# Patient Record
Sex: Male | Born: 1955 | Race: White | Hispanic: No | Marital: Married | Smoking: Current every day smoker
Health system: Southern US, Community
[De-identification: ages and names within clinical notes are randomized; demographics above are authoritative.]

## PROBLEM LIST (undated history)

## (undated) DIAGNOSIS — F17210 Nicotine dependence, cigarettes, uncomplicated: Secondary | ICD-10-CM

## (undated) DIAGNOSIS — F419 Anxiety disorder, unspecified: Secondary | ICD-10-CM

## (undated) DIAGNOSIS — F32A Depression, unspecified: Secondary | ICD-10-CM

## (undated) DIAGNOSIS — I700 Atherosclerosis of aorta: Secondary | ICD-10-CM

## (undated) DIAGNOSIS — I10 Essential (primary) hypertension: Secondary | ICD-10-CM

## (undated) DIAGNOSIS — E785 Hyperlipidemia, unspecified: Secondary | ICD-10-CM

## (undated) DIAGNOSIS — Z87442 Personal history of urinary calculi: Secondary | ICD-10-CM

## (undated) DIAGNOSIS — J439 Emphysema, unspecified: Secondary | ICD-10-CM

## (undated) HISTORY — PX: APPENDECTOMY: SHX54

## (undated) HISTORY — PX: ORIF PROXIMAL TIBIAL PLATEAU FRACTURE: SUR953

## (undated) HISTORY — PX: CHOLECYSTECTOMY: SHX55

## (undated) HISTORY — PX: ORIF FEMUR FRACTURE: SHX2119

## (undated) HISTORY — PX: ORIF FOREARM FRACTURE: SHX2124

---

## 2021-04-12 ENCOUNTER — Other Ambulatory Visit: Payer: Self-pay | Admitting: *Deleted

## 2021-04-12 DIAGNOSIS — Z87891 Personal history of nicotine dependence: Secondary | ICD-10-CM

## 2021-04-12 DIAGNOSIS — F17210 Nicotine dependence, cigarettes, uncomplicated: Secondary | ICD-10-CM

## 2021-04-27 ENCOUNTER — Ambulatory Visit (INDEPENDENT_AMBULATORY_CARE_PROVIDER_SITE_OTHER): Payer: Self-pay | Admitting: Acute Care

## 2021-04-27 ENCOUNTER — Other Ambulatory Visit: Payer: Self-pay

## 2021-04-27 ENCOUNTER — Encounter: Payer: Self-pay | Admitting: Acute Care

## 2021-04-27 DIAGNOSIS — F17210 Nicotine dependence, cigarettes, uncomplicated: Secondary | ICD-10-CM

## 2021-04-27 NOTE — Progress Notes (Addendum)
Virtual Visit via Telephone Note ? ?I connected with Steven Watson on 04/27/21 at 10:00 AM EDT by telephone and verified that I am speaking with the correct person using two identifiers. ? ?Location: ?Patient:  At home ?Provider:  Working from home ?  ?I discussed the limitations, risks, security and privacy concerns of performing an evaluation and management service by telephone and the availability of in person appointments. I also discussed with the patient that there may be a patient responsible charge related to this service. The patient expressed understanding and agreed to proceed. ? ? ? ? ?Shared Decision Making Visit Lung Cancer Screening Program ?([id number]) ? ? ?Eligibility: ?Age 66 y.o. ?Pack Years Smoking History Calculation 25 pack year smoking history ?(# packs/per year x # years smoked) ?Recent History of coughing up blood  no ?Unexplained weight loss? no ?( >Than 15 pounds within the last 6 months ) ?Prior History Lung / other cancer no ?(Diagnosis within the last 5 years already requiring surveillance chest CT Scans). ?Smoking Status Current Smoker ?Former Smokers: Years since quit:  NA ? Quit Date:  NA ? ?Visit Components: ?Discussion included one or more decision making aids. yes ?Discussion included risk/benefits of screening. yes ?Discussion included potential follow up diagnostic testing for abnormal scans. yes ?Discussion included meaning and risk of over diagnosis. yes ?Discussion included meaning and risk of False Positives. yes ?Discussion included meaning of total radiation exposure. yes ? ?Counseling Included: ?Importance of adherence to annual lung cancer LDCT screening. yes ?Impact of comorbidities on ability to participate in the program. yes ?Ability and willingness to under diagnostic treatment. yes ? ?Smoking Cessation Counseling: ?Current Smokers:  ?Discussed importance of smoking cessation. yes ?Information about tobacco cessation classes and interventions provided to patient.  yes ?Patient provided with "ticket" for LDCT Scan. yes ?Symptomatic Patient. no ? Counseling NA ?Diagnosis Code: Tobacco Use Z72.0 ?Asymptomatic Patient yes ? Counseling (Intermediate counseling: > three minutes counseling) T0626 ?Former Smokers:  ?Discussed the importance of maintaining cigarette abstinence. yes ?Diagnosis Code: Personal History of Nicotine Dependence. R48.546 ?Information about tobacco cessation classes and interventions provided to patient. Yes ?Patient provided with "ticket" for LDCT Scan. yes ?Written Order for Lung Cancer Screening with LDCT placed in Epic. Yes ?(CT Chest Lung Cancer Screening Low Dose W/O CM) [id number] ?Z12.2-Screening of respiratory organs ?Z87.891-Personal history of nicotine dependence ? ?I have spent 25 minutes of face to face/ virtual visit   time with  Steven Watson discussing the risks and benefits of lung cancer screening. We viewed / discussed a power point together that explained in detail the above noted topics. We paused at intervals to allow for questions to be asked and answered to ensure understanding.We discussed that the single most powerful action that he can take to decrease his risk of developing lung cancer is to quit smoking. We discussed whether or not he is ready to commit to setting a quit date. We discussed options for tools to aid in quitting smoking including nicotine replacement therapy, non-nicotine medications, support groups, Quit Smart classes, and behavior modification. We discussed that often times setting smaller, more achievable goals, such as eliminating 1 cigarette a day for a week and then 2 cigarettes a day for a week can be helpful in slowly decreasing the number of cigarettes smoked. This allows for a sense of accomplishment as well as providing a clinical benefit. I provided  him  with smoking cessation  information  with contact information for community resources, classes, free nicotine replacement therapy,  and access to mobile apps,  text messaging, and on-line smoking cessation help. I have also provided  him  the office contact information in the event he needs to contact me, or the screening staff. We discussed the time and location of the scan, and that either Doroteo Glassman RN, Joella Prince, RN  or I will call / send a letter with the results within 24-72 hours of receiving them. The patient verbalized understanding of all of  the above and had no further questions upon leaving the office. They have my contact information in the event they have any further questions. ? ?I spent 3 minutes counseling on smoking cessation and the health risks of continued tobacco abuse. ? ?I explained to the patient that there has been a high incidence of coronary artery disease noted on these exams. I explained that this is a non-gated exam therefore degree or severity cannot be determined. This patient is on statin therapy. I have asked the patient to follow-up with their PCP regarding any incidental finding of coronary artery disease and management with diet or medication as their PCP  feels is clinically indicated. The patient verbalized understanding of the above and had no further questions upon completion of the visit. ? ?  ? ? ?Magdalen Spatz, NP ?04/27/2021 ? ? ? ? ? ? ?

## 2021-04-27 NOTE — Patient Instructions (Signed)

## 2021-04-29 ENCOUNTER — Other Ambulatory Visit: Payer: Self-pay

## 2021-04-29 ENCOUNTER — Ambulatory Visit
Admission: RE | Admit: 2021-04-29 | Discharge: 2021-04-29 | Disposition: A | Discharge status: Home / Self Care | Payer: Medicare Other | Source: Ambulatory Visit | Attending: Acute Care | Admitting: Acute Care

## 2021-04-29 DIAGNOSIS — Z87891 Personal history of nicotine dependence: Secondary | ICD-10-CM | POA: Diagnosis present

## 2021-04-29 DIAGNOSIS — F17210 Nicotine dependence, cigarettes, uncomplicated: Secondary | ICD-10-CM | POA: Insufficient documentation

## 2021-05-02 ENCOUNTER — Other Ambulatory Visit: Payer: Self-pay

## 2021-05-02 DIAGNOSIS — F17210 Nicotine dependence, cigarettes, uncomplicated: Secondary | ICD-10-CM

## 2021-05-02 DIAGNOSIS — Z87891 Personal history of nicotine dependence: Secondary | ICD-10-CM

## 2021-05-12 ENCOUNTER — Other Ambulatory Visit (HOSPITAL_COMMUNITY): Payer: Self-pay | Admitting: Infectious Diseases

## 2021-05-12 ENCOUNTER — Other Ambulatory Visit: Payer: Self-pay | Admitting: Infectious Diseases

## 2021-05-12 DIAGNOSIS — R1031 Right lower quadrant pain: Secondary | ICD-10-CM

## 2021-05-16 ENCOUNTER — Ambulatory Visit
Admission: RE | Admit: 2021-05-16 | Discharge: 2021-05-16 | Disposition: A | Discharge status: Home / Self Care | Payer: Medicare Other | Source: Ambulatory Visit | Attending: Infectious Diseases | Admitting: Infectious Diseases

## 2021-05-16 DIAGNOSIS — R1031 Right lower quadrant pain: Secondary | ICD-10-CM | POA: Diagnosis present

## 2021-05-24 ENCOUNTER — Other Ambulatory Visit: Payer: Medicare Other

## 2021-05-31 ENCOUNTER — Encounter: Payer: Self-pay | Admitting: Urology

## 2021-05-31 ENCOUNTER — Ambulatory Visit (INDEPENDENT_AMBULATORY_CARE_PROVIDER_SITE_OTHER): Payer: Medicare Other | Admitting: Urology

## 2021-05-31 VITALS — BP 177/82 | HR 73 | Ht 68.0 in | Wt 155.0 lb

## 2021-05-31 DIAGNOSIS — N200 Calculus of kidney: Secondary | ICD-10-CM | POA: Diagnosis not present

## 2021-05-31 NOTE — Progress Notes (Signed)
? ?  05/31/21 ?9:53 AM  ? ?Steven Watson ?November 10, 1955 ?[id number] ? ?CC: Nephrolithiasis ? ?HPI: ?I saw Steven Watson today for nephrolithiasis.  He originally presented to his PCP on 05/12/2021 with right-sided flank pain, urinalysis showed pyuria but culture was negative, and he was started on Flomax.  He felt like he passed a few stones on the right side and his pain had improved, and a CT was ultimately performed on 05/16/2021.  This showed no right-sided hydronephrosis or right-sided stones, and a punctate small left lower pole nonobstructive stone.  He feels well today and denies any urinary symptoms or complaints. ? ?He has a history of recurrent kidney stones, and required ureteroscopy about 7 years ago in [place].  We will obtain those outside records. ? ? ?Social History:  reports that he has quit smoking. His smoking use included cigarettes. He has a 25.00 pack-year smoking history. He has been exposed to tobacco smoke. He has never used smokeless tobacco. He reports that he does not drink alcohol and does not use drugs. ? ?Physical Exam: ?BP (!) 177/82   Pulse 73   Ht '5\' 8"'$  (1.727 m)   Wt 155 lb (70.3 kg)   BMI 23.57 kg/m?   ? ?Constitutional:  Alert and oriented, No acute distress. ?Cardiovascular: No clubbing, cyanosis, or edema. ?Respiratory: Normal respiratory effort, no increased work of breathing. ?GI: Abdomen is soft, nontender, nondistended, no abdominal masses ? ?Laboratory Data: ?Reviewed ? ?Pertinent Imaging: ?I have personally viewed and interpreted the CT showing a punctate left lower pole stone, no hydronephrosis or ureteral stones. ? ?Assessment & Plan:   ?66 year old male with recurrent nephrolithiasis, recently passed right-sided stone, with recent CT 05/16/2021 showing only a punctate left lower pole stone and no ureteral stones or hydronephrosis. ? ?We discussed general stone prevention strategies including adequate hydration with goal of producing 2.5 L of urine daily, increasing citric  acid intake, increasing calcium intake during high oxalate meals, minimizing animal protein, and decreasing salt intake. Information about dietary recommendations given today.  ? ?RTC 1 year KUB for surveillance ? ? ?Nickolas Madrid, MD ?05/31/2021 ? ?[facility] ?[address] ?[place], [place] [address] ?[phone] ? ?

## 2021-05-31 NOTE — Patient Instructions (Signed)
Dietary Guidelines to Help Prevent Kidney Stones Kidney stones are deposits of minerals and salts that form inside your kidneys. Your risk of developing kidney stones may be greater depending on your diet, your lifestyle, the medicines you take, and whether you have certain medical conditions. Most people can lower their chances of developing kidney stones by following the instructions below. Your dietitian may give you more specific instructions depending on your overall health and the type of kidney stones you tend to develop. What are tips for following this plan? Reading food labels  Choose foods with "no salt added" or "low-salt" labels. Limit your salt (sodium) intake to less than 1,500 mg a day. Choose foods with calcium for each meal and snack. Try to eat about 300 mg of calcium at each meal. Foods that contain 200-500 mg of calcium a serving include: 8 oz (237 mL) of milk, calcium-fortifiednon-dairy milk, and calcium-fortifiedfruit juice. Calcium-fortified means that calcium has been added to these drinks. 8 oz (237 mL) of kefir, yogurt, and soy yogurt. 4 oz (114 g) of tofu. 1 oz (28 g) of cheese. 1 cup (150 g) of dried figs. 1 cup (91 g) of cooked broccoli. One 3 oz (85 g) can of sardines or mackerel. Most people need 1,000-1,500 mg of calcium a day. Talk to your dietitian about how much calcium is recommended for you. Shopping Buy plenty of fresh fruits and vegetables. Most people do not need to avoid fruits and vegetables, even if these foods contain nutrients that may contribute to kidney stones. When shopping for convenience foods, choose: Whole pieces of fruit. Pre-made salads with dressing on the side. Low-fat fruit and yogurt smoothies. Avoid buying frozen meals or prepared deli foods. These can be high in sodium. Look for foods with live cultures, such as yogurt and kefir. Choose high-fiber grains, such as whole-wheat breads, oat bran, and wheat cereals. Cooking Do not add  salt to food when cooking. Place a salt shaker on the table and allow each person to add his or her own salt to taste. Use vegetable protein, such as beans, textured vegetable protein (TVP), or tofu, instead of meat in pasta, casseroles, and soups. Meal planning Eat less salt, if told by your dietitian. To do this: Avoid eating processed or pre-made food. Avoid eating fast food. Eat less animal protein, including cheese, meat, poultry, or fish, if told by your dietitian. To do this: Limit the number of times you have meat, poultry, fish, or cheese each week. Eat a diet free of meat at least 2 days a week. Eat only one serving each day of meat, poultry, fish, or seafood. When you prepare animal protein, cut pieces into small portion sizes. For most meat and fish, one serving is about the size of the palm of your hand. Eat at least five servings of fresh fruits and vegetables each day. To do this: Keep fruits and vegetables on hand for snacks. Eat one piece of fruit or a handful of berries with breakfast. Have a salad and fruit at lunch. Have two kinds of vegetables at dinner. Limit foods that are high in a substance called oxalate. These include: Spinach (cooked), rhubarb, beets, sweet potatoes, and Swiss chard. Peanuts. Potato chips, french fries, and baked potatoes with skin on. Nuts and nut products. Chocolate. If you regularly take a diuretic medicine, make sure to eat at least 1 or 2 servings of fruits or vegetables that are high in potassium each day. These include: Avocado. Banana. Orange, prune,   carrot, or tomato juice. Baked potato. Cabbage. Beans and split peas. Lifestyle  Drink enough fluid to keep your urine pale yellow. This is the most important thing you can do. Spread your fluid intake throughout the day. If you drink alcohol: Limit how much you use to: 0-1 drink a day for women who are not pregnant. 0-2 drinks a day for men. Be aware of how much alcohol is in your  drink. In the [place] one drink equals one 12 oz bottle of beer (355 mL), one 5 oz glass of wine (148 mL), or one 1 oz glass of hard liquor (44 mL). Lose weight if told by your health care provider. Work with your dietitian to find an eating plan and weight loss strategies that work best for you. General information Talk to your health care provider and dietitian about taking daily supplements. You may be told the following depending on your health and the cause of your kidney stones: Not to take supplements with vitamin C. To take a calcium supplement. To take a daily probiotic supplement. To take other supplements such as magnesium, fish oil, or vitamin B6. Take over-the-counter and prescription medicines only as told by your health care provider. These include supplements. What foods should I limit? Limit your intake of the following foods, or eat them as told by your dietitian. Vegetables Spinach. Rhubarb. Beets. Canned vegetables. Pickles. Olives. Baked potatoes with skin. Grains Wheat bran. Baked goods. Salted crackers. Cereals high in sugar. Meats and other proteins Nuts. Nut butters. Large portions of meat, poultry, or fish. Salted, precooked, or cured meats, such as sausages, meat loaves, and hot dogs. Dairy Cheese. Beverages Regular soft drinks. Regular vegetable juice. Seasonings and condiments Seasoning blends with salt. Salad dressings. Soy sauce. Ketchup. Barbecue sauce. Other foods Canned soups. Canned pasta sauce. Casseroles. Pizza. Lasagna. Frozen meals. Potato chips. French fries. The items listed above may not be a complete list of foods and beverages you should limit. Contact a dietitian for more information. What foods should I avoid? Talk to your dietitian about specific foods you should avoid based on the type of kidney stones you have and your overall health. Fruits Grapefruit. The item listed above may not be a complete list of foods and beverages you should  avoid. Contact a dietitian for more information. Summary Kidney stones are deposits of minerals and salts that form inside your kidneys. You can lower your risk of kidney stones by making changes to your diet. The most important thing you can do is drink enough fluid. Drink enough fluid to keep your urine pale yellow. Talk to your dietitian about how much calcium you should have each day, and eat less salt and animal protein as told by your dietitian. This information is not intended to replace advice given to you by your health care provider. Make sure you discuss any questions you have with your health care provider. Document Revised: 09/27/2020 Document Reviewed: 09/27/2020 Elsevier Patient Education  2023 Elsevier Inc.  

## 2022-02-10 ENCOUNTER — Other Ambulatory Visit: Payer: Self-pay | Admitting: Sports Medicine

## 2022-02-10 DIAGNOSIS — M25511 Pain in right shoulder: Secondary | ICD-10-CM

## 2022-02-10 DIAGNOSIS — M19011 Primary osteoarthritis, right shoulder: Secondary | ICD-10-CM

## 2022-02-10 DIAGNOSIS — S4991XA Unspecified injury of right shoulder and upper arm, initial encounter: Secondary | ICD-10-CM

## 2022-02-16 ENCOUNTER — Ambulatory Visit: Payer: Medicare Other

## 2022-02-21 ENCOUNTER — Ambulatory Visit
Admission: RE | Admit: 2022-02-21 | Discharge: 2022-02-21 | Disposition: A | Discharge status: Home / Self Care | Payer: Medicare Other | Source: Ambulatory Visit | Attending: Sports Medicine | Admitting: Sports Medicine

## 2022-02-21 DIAGNOSIS — S4991XA Unspecified injury of right shoulder and upper arm, initial encounter: Secondary | ICD-10-CM | POA: Insufficient documentation

## 2022-02-21 DIAGNOSIS — M19011 Primary osteoarthritis, right shoulder: Secondary | ICD-10-CM | POA: Diagnosis present

## 2022-02-21 DIAGNOSIS — M25511 Pain in right shoulder: Secondary | ICD-10-CM | POA: Diagnosis not present

## 2022-03-16 DIAGNOSIS — D120 Benign neoplasm of cecum: Secondary | ICD-10-CM | POA: Diagnosis not present

## 2022-03-16 DIAGNOSIS — Z1211 Encounter for screening for malignant neoplasm of colon: Secondary | ICD-10-CM | POA: Diagnosis present

## 2022-03-16 DIAGNOSIS — D122 Benign neoplasm of ascending colon: Secondary | ICD-10-CM | POA: Diagnosis not present

## 2022-03-16 DIAGNOSIS — K640 First degree hemorrhoids: Secondary | ICD-10-CM | POA: Diagnosis not present

## 2022-05-01 ENCOUNTER — Ambulatory Visit: Payer: Medicare Other | Attending: Infectious Diseases

## 2022-05-10 ENCOUNTER — Other Ambulatory Visit: Payer: Self-pay

## 2022-05-16 ENCOUNTER — Telehealth: Payer: Self-pay

## 2022-05-16 NOTE — Telephone Encounter (Signed)
Returned call to patient for scheduling annual LDCT. No answer.  Left VM and call back number

## 2022-06-05 ENCOUNTER — Other Ambulatory Visit: Payer: Self-pay | Admitting: *Deleted

## 2022-06-05 DIAGNOSIS — N200 Calculus of kidney: Secondary | ICD-10-CM

## 2022-06-06 ENCOUNTER — Ambulatory Visit: Payer: Medicare Other | Admitting: Urology

## 2022-06-13 ENCOUNTER — Encounter: Payer: Self-pay | Admitting: Urology

## 2022-06-13 ENCOUNTER — Ambulatory Visit
Admission: RE | Admit: 2022-06-13 | Discharge: 2022-06-13 | Disposition: A | Discharge status: Home / Self Care | Payer: Medicare Other | Attending: Urology | Admitting: Urology

## 2022-06-13 ENCOUNTER — Ambulatory Visit
Admission: RE | Admit: 2022-06-13 | Discharge: 2022-06-13 | Disposition: A | Discharge status: Home / Self Care | Payer: Medicare Other | Source: Ambulatory Visit | Attending: Urology | Admitting: Urology

## 2022-06-13 ENCOUNTER — Ambulatory Visit (INDEPENDENT_AMBULATORY_CARE_PROVIDER_SITE_OTHER): Payer: Medicare Other | Admitting: Urology

## 2022-06-13 VITALS — BP 136/71 | HR 65 | Ht 67.5 in | Wt 174.0 lb

## 2022-06-13 DIAGNOSIS — Z125 Encounter for screening for malignant neoplasm of prostate: Secondary | ICD-10-CM | POA: Diagnosis not present

## 2022-06-13 DIAGNOSIS — N200 Calculus of kidney: Secondary | ICD-10-CM

## 2022-06-13 NOTE — Progress Notes (Signed)
   06/13/2022 11:39 AM   Hermelinda Medicus Jul 02, 1955 [id number]  Reason for visit: Follow up nephrolithiasis, hematuria, PSA screening  HPI: 67 year old male who I saw in May 2023 when he had some mild hematuria and flank pain and passed a few kidney stones.  Follow-up CT showed a punctate small left lower pole nonobstructive stone.  He has required ureteroscopy in the past around 2017 in [place].  He denies any problems since our last visit.  Specifically no gross hematuria, flank pain, urinary symptoms, or kidney stones.  I personally viewed and interpreted his KUB today that shows possible punctate left lower pole stones but no other new kidney stones.  We discussed general stone prevention strategies including adequate hydration with goal of producing 2.5 L of urine daily, increasing citric acid intake, increasing calcium intake during high oxalate meals, minimizing animal protein, and decreasing salt intake. Information about dietary recommendations given today.   Recent PSA from February 2024 was normal at 0.55, we reviewed the AUA guidelines regarding risk and benefits of screening, can continue screening every other year with PCP.   Follow-up with urology as needed, return precautions were discussed    Sondra Come, MD  [facility] [address], [address] [place], [place] [address] [phone]

## 2022-08-09 ENCOUNTER — Emergency Department
Admission: EM | Admit: 2022-08-09 | Discharge: 2022-08-09 | Disposition: A | Discharge status: Home / Self Care | Payer: Medicare Other | Attending: Emergency Medicine | Admitting: Emergency Medicine

## 2022-08-09 ENCOUNTER — Other Ambulatory Visit: Payer: Self-pay

## 2022-08-09 ENCOUNTER — Encounter: Payer: Self-pay | Admitting: *Deleted

## 2022-08-09 DIAGNOSIS — R21 Rash and other nonspecific skin eruption: Secondary | ICD-10-CM | POA: Insufficient documentation

## 2022-08-09 DIAGNOSIS — L089 Local infection of the skin and subcutaneous tissue, unspecified: Secondary | ICD-10-CM | POA: Diagnosis present

## 2022-08-09 LAB — CBG MONITORING, ED: Glucose-Capillary: 152 mg/dL — ABNORMAL HIGH (ref 70–99)

## 2022-08-09 MED ORDER — TRIAMCINOLONE ACETONIDE 40 MG/ML IJ SUSP
40.0000 mg | Freq: Once | INTRAMUSCULAR | Status: AC
  Administered 2022-08-09: 40 mg via INTRAMUSCULAR
  Filled 2022-08-09: qty 1

## 2022-08-09 MED ORDER — DOXYCYCLINE HYCLATE 100 MG PO TABS
100.0000 mg | ORAL_TABLET | Freq: Once | ORAL | Status: AC
  Administered 2022-08-09: 100 mg via ORAL
  Filled 2022-08-09: qty 1

## 2022-08-09 MED ORDER — DOXYCYCLINE HYCLATE 50 MG PO CAPS: 50.0000 mg | ORAL_CAPSULE | Freq: Two times a day (BID) | ORAL | 0 refills | Status: AC

## 2022-08-09 NOTE — ED Triage Notes (Signed)
Pt was working in the shrubs and trees 4 days ago.  Pt has redness and pain in right forearm.  Abrasion noted to forearm.  Denies other injury.  Pt alert.  Speech clear.

## 2022-08-09 NOTE — Discharge Instructions (Addendum)
Take Doxycycline twice daily.

## 2022-08-09 NOTE — ED Provider Notes (Signed)
[facility] Provider Note  Patient Contact: 9:59 PM (approximate)   History   Abrasion   HPI  Steven Watson is a 67 y.o. male with a history of recent exposure to trees and brush, presents to the emergency department with erythema along the right forearm with small pustules.  Patient has been applying Neosporin but has attempted no other alleviating measures.  He is uncertain if he is particularly sensitive to poison ivy or poison oak.  No fever or chills.  No prior history of MRSA infection.      Physical Exam   Triage Vital Signs: ED Triage Vitals  Enc Vitals Group     BP 08/09/22 2146 (!) 173/67     Pulse Rate 08/09/22 2146 78     Resp 08/09/22 2146 18     Temp 08/09/22 2146 98.5 F (36.9 C)     Temp Source 08/09/22 2146 Oral     SpO2 08/09/22 2146 98 %     Weight 08/09/22 2144 172 lb (78 kg)     Height 08/09/22 2144 5\' 7"  (1.702 m)     Head Circumference --      Peak Flow --      Pain Score 08/09/22 2144 3     Pain Loc --      Pain Edu? --      Excl. in GC? --     Most recent vital signs: Vitals:   08/09/22 2146  BP: (!) 173/67  Pulse: 78  Resp: 18  Temp: 98.5 F (36.9 C)  SpO2: 98%     General: Alert and in no acute distress. Eyes:  PERRL. EOMI. Head: No acute traumatic findings ENT:      Nose: No congestion/rhinnorhea.      Mouth/Throat: Mucous membranes are moist. Neck: No stridor. No cervical spine tenderness to palpation. Cardiovascular:  Good peripheral perfusion Respiratory: Normal respiratory effort without tachypnea or retractions. Lungs CTAB. Good air entry to the bases with no decreased or absent breath sounds. Gastrointestinal: Bowel sounds 4 quadrants. Soft and nontender to palpation. No guarding or rigidity. No palpable masses. No distention. No CVA tenderness. Musculoskeletal: Full range of motion to all extremities.  Neurologic:  No gross focal neurologic deficits are appreciated.  Skin: Patient has  erythema along the dorsal aspect of the right forearm with small pustules.    ED Results / Procedures / Treatments   Labs (all labs ordered are listed, but only abnormal results are displayed) Labs Reviewed  CBG MONITORING, ED - Abnormal; Notable for the following components:      Result Value   Glucose-Capillary 152 (*)    All other components within normal limits      PROCEDURES:  Critical Care performed: No  Procedures   MEDICATIONS ORDERED IN ED: Medications  triamcinolone acetonide (KENALOG-40) injection 40 mg (has no administration in time range)     IMPRESSION / MDM / ASSESSMENT AND PLAN / ED COURSE  I reviewed the triage vital signs and the nursing notes.                              Assessment and plan Rash 67 year old male presents to the emergency department with erythema of the right forearm.  Differential diagnosis includes poison ivy/poison oak versus cellulitis.  Given well-controlled diabetes, will give patient Kenalog injection and will discharge patient with doxycycline.  Return precautions were given to return with new or  worsening symptoms.  All patient questions were answered.      FINAL CLINICAL IMPRESSION(S) / ED DIAGNOSES   Final diagnoses:  Rash     Rx / DC Orders   ED Discharge Orders     None        Note:  This document was prepared using Dragon voice recognition software and may include unintentional dictation errors.   Pia Mau [place], Cordelia Poche 08/09/22 2202    Pilar Jarvis, MD 08/10/22 [phone]

## 2023-03-12 ENCOUNTER — Encounter: Payer: Self-pay | Admitting: Ophthalmology

## 2023-03-13 ENCOUNTER — Encounter: Payer: Self-pay | Admitting: Ophthalmology

## 2023-03-13 ENCOUNTER — Encounter: Payer: Self-pay | Admitting: Anesthesiology

## 2023-03-22 ENCOUNTER — Ambulatory Visit: Admit: 2023-03-22 | Payer: Medicare Other | Admitting: Ophthalmology

## 2023-03-22 HISTORY — DX: Depression, unspecified: F32.A

## 2023-03-22 HISTORY — DX: Essential (primary) hypertension: I10

## 2023-03-22 HISTORY — DX: Personal history of urinary calculi: Z87.442

## 2023-03-22 HISTORY — DX: Anxiety disorder, unspecified: F41.9

## 2023-03-22 HISTORY — DX: Hyperlipidemia, unspecified: E78.5

## 2023-03-22 SURGERY — CATARACT EXTRACTION PHACO AND INTRAOCULAR LENS PLACEMENT (IOC)
Anesthesia: Topical | Laterality: Right

## 2023-05-30 ENCOUNTER — Other Ambulatory Visit: Payer: Self-pay | Admitting: Emergency Medicine

## 2023-05-30 DIAGNOSIS — R911 Solitary pulmonary nodule: Secondary | ICD-10-CM

## 2023-05-30 DIAGNOSIS — F17210 Nicotine dependence, cigarettes, uncomplicated: Secondary | ICD-10-CM

## 2023-05-30 DIAGNOSIS — Z122 Encounter for screening for malignant neoplasm of respiratory organs: Secondary | ICD-10-CM

## 2023-06-19 ENCOUNTER — Ambulatory Visit
Admission: RE | Admit: 2023-06-19 | Discharge: 2023-06-19 | Disposition: A | Discharge status: Home / Self Care | Source: Ambulatory Visit | Attending: Emergency Medicine | Admitting: Emergency Medicine

## 2023-06-19 DIAGNOSIS — F17210 Nicotine dependence, cigarettes, uncomplicated: Secondary | ICD-10-CM | POA: Insufficient documentation

## 2023-06-19 DIAGNOSIS — Z122 Encounter for screening for malignant neoplasm of respiratory organs: Secondary | ICD-10-CM | POA: Insufficient documentation

## 2023-06-19 DIAGNOSIS — R911 Solitary pulmonary nodule: Secondary | ICD-10-CM | POA: Diagnosis present

## 2023-09-10 ENCOUNTER — Encounter: Payer: Self-pay | Admitting: Ophthalmology

## 2023-09-11 ENCOUNTER — Encounter: Payer: Self-pay | Admitting: Ophthalmology

## 2023-09-11 NOTE — Anesthesia Preprocedure Evaluation (Addendum)
 Anesthesia Evaluation    Airway        Dental   Pulmonary Current Smoker          Cardiovascular hypertension,      Neuro/Psych    GI/Hepatic   Endo/Other    Renal/GU      Musculoskeletal   Abdominal   Peds  Hematology   Anesthesia Other Findings Hypertension  History of kidney stones Anxiety  Depression Hyperlipidemia  Cigarette smoker Pulmonary emphysema CAD Active doing yard work   Reproductive/Obstetrics                              Anesthesia Physical Anesthesia Plan  ASA: 3  Anesthesia Plan: MAC   Post-op Pain Management:    Induction: Intravenous  PONV Risk Score and Plan:   Airway Management Planned: Natural Airway and Nasal Cannula  Additional Equipment:   Intra-op Plan:   Post-operative Plan:   Informed Consent: I have reviewed the patients History and Physical, chart, labs and discussed the procedure including the risks, benefits and alternatives for the proposed anesthesia with the patient or authorized representative who has indicated his/her understanding and acceptance.     Dental Advisory Given  Plan Discussed with: Anesthesiologist, CRNA and Surgeon  Anesthesia Plan Comments: (Patient consented for risks of anesthesia including but not limited to:  - adverse reactions to medications - damage to eyes, teeth, lips or other oral mucosa - nerve damage due to positioning  - sore throat or hoarseness - Damage to heart, brain, nerves, lungs, other parts of body or loss of life  Patient voiced understanding and assent.)        Anesthesia Quick Evaluation

## 2023-09-18 NOTE — Discharge Instructions (Signed)

## 2023-09-20 ENCOUNTER — Encounter: Payer: Self-pay | Admitting: Anesthesiology

## 2023-09-20 ENCOUNTER — Ambulatory Visit: Admission: RE | Admit: 2023-09-20 | Source: Home / Self Care | Admitting: Ophthalmology

## 2023-09-20 ENCOUNTER — Encounter: Admission: RE | Payer: Self-pay | Source: Home / Self Care

## 2023-09-20 HISTORY — DX: Emphysema, unspecified: J43.9

## 2023-09-20 HISTORY — DX: Atherosclerosis of aorta: I70.0

## 2023-09-20 HISTORY — DX: Nicotine dependence, cigarettes, uncomplicated: F17.210

## 2023-09-20 SURGERY — PHACOEMULSIFICATION, CATARACT, WITH IOL INSERTION
Anesthesia: Topical | Laterality: Right

## 2023-11-23 ENCOUNTER — Other Ambulatory Visit: Payer: Self-pay | Admitting: Infectious Diseases

## 2023-11-23 DIAGNOSIS — R413 Other amnesia: Secondary | ICD-10-CM

## 2023-11-23 DIAGNOSIS — I10 Essential (primary) hypertension: Secondary | ICD-10-CM

## 2023-11-30 ENCOUNTER — Ambulatory Visit
Admission: RE | Admit: 2023-11-30 | Discharge: 2023-11-30 | Disposition: A | Discharge status: Home / Self Care | Source: Ambulatory Visit | Attending: Infectious Diseases | Admitting: Infectious Diseases

## 2023-11-30 DIAGNOSIS — R413 Other amnesia: Secondary | ICD-10-CM | POA: Insufficient documentation

## 2023-11-30 DIAGNOSIS — I10 Essential (primary) hypertension: Secondary | ICD-10-CM | POA: Diagnosis present

## 2024-01-08 NOTE — Discharge Instructions (Signed)

## 2024-01-09 ENCOUNTER — Encounter: Payer: Self-pay | Admitting: Ophthalmology

## 2024-01-10 ENCOUNTER — Encounter: Payer: Self-pay | Admitting: Ophthalmology

## 2024-01-10 ENCOUNTER — Ambulatory Visit
Admission: RE | Admit: 2024-01-10 | Discharge: 2024-01-10 | Disposition: A | Attending: Ophthalmology | Admitting: Ophthalmology

## 2024-01-10 ENCOUNTER — Ambulatory Visit: Admitting: Anesthesiology

## 2024-01-10 ENCOUNTER — Encounter: Admission: RE | Disposition: A | Payer: Self-pay | Source: Home / Self Care | Attending: Ophthalmology

## 2024-01-10 ENCOUNTER — Other Ambulatory Visit: Payer: Self-pay

## 2024-01-10 DIAGNOSIS — H2511 Age-related nuclear cataract, right eye: Secondary | ICD-10-CM | POA: Diagnosis present

## 2024-01-10 HISTORY — PX: CATARACT EXTRACTION W/PHACO: SHX586

## 2024-01-10 SURGERY — PHACOEMULSIFICATION, CATARACT, WITH IOL INSERTION
Anesthesia: Topical | Laterality: Right

## 2024-01-10 MED ORDER — FENTANYL CITRATE (PF) 100 MCG/2ML IJ SOLN
INTRAMUSCULAR | Status: DC | PRN
  Administered 2024-01-10: 50 ug via INTRAVENOUS

## 2024-01-10 MED ORDER — PHENYLEPHRINE HCL 10 % OP SOLN
1.0000 [drp] | OPHTHALMIC | Status: AC
  Administered 2024-01-10 (×3): 1 [drp] via OPHTHALMIC

## 2024-01-10 MED ORDER — BRIMONIDINE TARTRATE-TIMOLOL 0.2-0.5 % OP SOLN
OPHTHALMIC | Status: DC | PRN
  Administered 2024-01-10: 1 [drp] via OPHTHALMIC

## 2024-01-10 MED ORDER — SIGHTPATH DOSE#1 BSS IO SOLN
INTRAOCULAR | Status: DC | PRN
  Administered 2024-01-10: 15 mL via INTRAOCULAR

## 2024-01-10 MED ORDER — CYCLOPENTOLATE HCL 2 % OP SOLN
OPHTHALMIC | Status: AC
  Filled 2024-01-10: qty 2

## 2024-01-10 MED ORDER — SIGHTPATH DOSE#1 NA CHONDROIT SULF-NA HYALURON 20-15 MG/0.5ML IO SOSY
INTRAOCULAR | Status: DC | PRN
  Administered 2024-01-10: .5 mL via INTRAOCULAR

## 2024-01-10 MED ORDER — LIDOCAINE HCL (PF) 2 % IJ SOLN
INTRAOCULAR | Status: DC | PRN
  Administered 2024-01-10: 4 mL via INTRAOCULAR

## 2024-01-10 MED ORDER — TETRACAINE HCL 0.5 % OP SOLN
OPHTHALMIC | Status: AC
  Filled 2024-01-10: qty 4

## 2024-01-10 MED ORDER — TETRACAINE HCL 0.5 % OP SOLN
1.0000 [drp] | OPHTHALMIC | Status: DC
  Administered 2024-01-10: 1 [drp] via OPHTHALMIC

## 2024-01-10 MED ORDER — FENTANYL CITRATE (PF) 100 MCG/2ML IJ SOLN
INTRAMUSCULAR | Status: AC
  Filled 2024-01-10: qty 2

## 2024-01-10 MED ORDER — MOXIFLOXACIN HCL 0.5 % OP SOLN
OPHTHALMIC | Status: DC | PRN
  Administered 2024-01-10: .2 mL via OPHTHALMIC

## 2024-01-10 MED ORDER — CYCLOPENTOLATE HCL 2 % OP SOLN
1.0000 [drp] | OPHTHALMIC | Status: AC
  Administered 2024-01-10 (×3): 1 [drp] via OPHTHALMIC

## 2024-01-10 MED ORDER — PHENYLEPHRINE HCL 10 % OP SOLN
OPHTHALMIC | Status: AC
  Filled 2024-01-10: qty 5

## 2024-01-10 MED ORDER — SIGHTPATH DOSE#1 NA HYALUR & NA CHOND-NA HYALUR IO KIT
PACK | INTRAOCULAR | Status: DC | PRN
  Administered 2024-01-10: 1 via OPHTHALMIC

## 2024-01-10 MED ORDER — MIDAZOLAM HCL 2 MG/2ML IJ SOLN
INTRAMUSCULAR | Status: AC
  Filled 2024-01-10: qty 2

## 2024-01-10 MED ORDER — MIDAZOLAM HCL (PF) 2 MG/2ML IJ SOLN
INTRAMUSCULAR | Status: DC | PRN
  Administered 2024-01-10: 2 mg via INTRAVENOUS

## 2024-01-10 MED ORDER — PHENYLEPHRINE-KETOROLAC 1-0.3 % IO SOLN
INTRAOCULAR | Status: DC | PRN
  Administered 2024-01-10: 138 mL via OPHTHALMIC

## 2024-01-10 SURGICAL SUPPLY — 9 items
DISSECTOR HYDRO NUCLEUS 50X22 (MISCELLANEOUS) ×1 IMPLANT
DRSG TEGADERM 2-3/8X2-3/4 SM (GAUZE/BANDAGES/DRESSINGS) ×1 IMPLANT
FEE CATARACT SUITE SIGHTPATH (MISCELLANEOUS) ×1 IMPLANT
GLOVE BIOGEL PI IND STRL 8 (GLOVE) ×1 IMPLANT
GLOVE SURG LX STRL 7.5 STRW (GLOVE) ×1 IMPLANT
LENS IOL ENVISTA ENVY TRC 20.5 IMPLANT
NDL FILTER BLUNT 18X1 1/2 (NEEDLE) ×1 IMPLANT
RING MALYGIN (MISCELLANEOUS) IMPLANT
SYR 3ML LL SCALE MARK (SYRINGE) ×1 IMPLANT

## 2024-01-10 NOTE — Transfer of Care (Signed)
 Immediate Anesthesia Transfer of Care Note  Patient: Steven Watson  Procedure(s) Performed: PHACOEMULSIFICATION, CATARACT, WITH IOL INSERTION 21.89, 02:25.7 (Right)  Patient Location: PACU  Anesthesia Type: MAC  Level of Consciousness: awake, alert  and patient cooperative  Airway and Oxygen Therapy: Patient Spontanous Breathing and Patient connected to supplemental oxygen  Post-op Assessment: Post-op Vital signs reviewed, Patient's Cardiovascular Status Stable, Respiratory Function Stable, Patent Airway and No signs of Nausea or vomiting  Post-op Vital Signs: Reviewed and stable  Complications: No notable events documented.

## 2024-01-10 NOTE — H&P (Signed)
 [facility]   Primary Care Physician:  Epifanio Alm SQUIBB, MD Ophthalmologist: Dr. Feliciano Ober  Pre-Procedure History & Physical: HPI:  Steven Watson is a 68 y.o. male here for cataract surgery.   Past Medical History:  Diagnosis Date   Anxiety    Aortic atherosclerosis    Cigarette smoker    Depression    History of kidney stones    Hyperlipidemia    Hypertension    Pulmonary emphysema (HCC)     Past Surgical History:  Procedure Laterality Date   APPENDECTOMY     CHOLECYSTECTOMY     ORIF FEMUR FRACTURE Left    ORIF FOREARM FRACTURE Right    ORIF PROXIMAL TIBIAL PLATEAU FRACTURE Left     Prior to Admission medications  Medication Sig Start Date End Date Taking? Authorizing Provider  Apoaequorin (PREVAGEN PO) Take by mouth daily.   Yes [provider]  buPROPion (WELLBUTRIN XL) 150 MG 24 hr tablet Take 300 mg by mouth daily. 05/12/21  Yes [provider]  losartan-hydrochlorothiazide (HYZAAR) 100-25 MG tablet Take 1 tablet by mouth daily. 05/14/21  Yes [provider]  Multiple Vitamin (MULTIVITAMIN) tablet Take 1 tablet by mouth daily.   Yes [provider]  rosuvastatin (CRESTOR) 10 MG tablet Take 10 mg by mouth at bedtime. 04/14/21  Yes [provider]  sertraline (ZOLOFT) 50 MG tablet Take 50 mg by mouth daily.   Yes [provider]  tamsulosin (FLOMAX) 0.4 MG CAPS capsule Take 0.4 mg by mouth daily. 05/12/21  Yes [provider]  VITAMIN D PO Take by mouth daily.   Yes [provider]  amLODipine (NORVASC) 5 MG tablet Take by mouth. 03/21/21 09/10/23  [provider]  multivitamin-lutein (OCUVITE-LUTEIN) CAPS capsule Take 1 capsule by mouth daily. Patient not taking: Reported on 01/09/2024    [provider]    Allergies as of 11/07/2023   (No Known Allergies)    History reviewed. No pertinent family history.  Social History   Socioeconomic History   Marital  status: Married    Spouse name: Not on file   Number of children: Not on file   Years of education: Not on file   Highest education level: Not on file  Occupational History   Not on file  Tobacco Use   Smoking status: Every Day    Current packs/day: 0.25    Average packs/day: 1 pack/day for 26.9 years (25.9 ttl pk-yrs)    Types: Cigarettes    Start date: 1999    Passive exposure: Past   Smokeless tobacco: Never  Vaping Use   Vaping status: Never Used  Substance and Sexual Activity   Alcohol use: Never   Drug use: Never   Sexual activity: Not Currently  Other Topics Concern   Not on file  Social History Narrative   Not on file   Social Drivers of Health   Tobacco Use: High Risk (12/25/2023)   Received from [facility]   Patient History    Smoking Tobacco Use: Every Day    Smokeless Tobacco Use: Never    Passive Exposure: Past  Financial Resource Strain: Low Risk  (10/30/2023)   Received from [facility]   Overall Financial Resource Strain (CARDIA)    Difficulty of Paying Living Expenses: Not very hard  Food Insecurity: No Food Insecurity (10/30/2023)   Received from [facility]   Epic    Within the past 12 months, you  worried that your food would run out before you got the money to buy more.: Never true    Within the past 12 months, the food you bought just didn't last and you didn't have money to get more.: Never true  Transportation Needs: No Transportation Needs (10/30/2023)   Received from [facility]    In the past 12 months, has lack of transportation kept you from medical appointments or from getting medications?: No    Lack of Transportation (Non-Medical): No  Physical Activity: Not on file  Stress: Not on file  Social Connections: Not on file  Intimate Partner Violence: Not on file  Depression (EYV7-0): Not on file  Alcohol Screen: Not on file  Housing: Low  Risk  (10/30/2023)   Received from [employer]   Epic    In the last 12 months, was there a time when you were not able to pay the mortgage or rent on time?: No    In the past 12 months, how many times have you moved where you were living?: 0    At any time in the past 12 months, were you homeless or living in a shelter (including now)?: No  Utilities: Not At Risk (10/30/2023)   Received from [facility]   Epic    In the past 12 months has the electric, gas, oil, or water company threatened to shut off services in your home?: No  Health Literacy: Not on file    Review of Systems: See HPI, otherwise negative ROS  Physical Exam: Wt 79.4 kg   BMI 26.61 kg/m  General:   Alert, cooperative in NAD Head:  Normocephalic and atraumatic. Respiratory:  Normal work of breathing. Cardiovascular:  RRR  Impression/Plan: Steven Watson is here for cataract surgery.  Risks, benefits, limitations, and alternatives regarding cataract surgery have been reviewed with the patient.  Questions have been answered.  All parties agreeable.   Feliciano Bryan Ober, MD  01/10/2024, 7:16 AM

## 2024-01-10 NOTE — Anesthesia Preprocedure Evaluation (Signed)
 Anesthesia Evaluation  Patient identified by MRN, date of birth, ID band Patient awake    Reviewed: Allergy & Precautions, NPO status , Patient's Chart, lab work & pertinent test results  History of Anesthesia Complications Negative for: history of anesthetic complications  Airway Mallampati: III  TM Distance: >3 FB Neck ROM: full    Dental no notable dental hx.    Pulmonary COPD, Current Smoker and Patient abstained from smoking.   Pulmonary exam normal        Cardiovascular hypertension, On Medications negative cardio ROS Normal cardiovascular exam     Neuro/Psych negative neurological ROS  negative psych ROS   GI/Hepatic negative GI ROS, Neg liver ROS,,,  Endo/Other  negative endocrine ROS    Renal/GU      Musculoskeletal   Abdominal   Peds  Hematology negative hematology ROS (+)   Anesthesia Other Findings Past Medical History: No date: Anxiety No date: Aortic atherosclerosis No date: Cigarette smoker No date: Depression No date: History of kidney stones No date: Hyperlipidemia No date: Hypertension No date: Pulmonary emphysema (HCC)  Past Surgical History: No date: APPENDECTOMY No date: CHOLECYSTECTOMY No date: ORIF FEMUR FRACTURE; Left No date: ORIF FOREARM FRACTURE; Right No date: ORIF PROXIMAL TIBIAL PLATEAU FRACTURE; Left  BMI    Body Mass Index: 26.61 kg/m      Reproductive/Obstetrics negative OB ROS                              Anesthesia Physical Anesthesia Plan  ASA: 3  Anesthesia Plan: MAC   Post-op Pain Management: Minimal or no pain anticipated   Induction: Intravenous  PONV Risk Score and Plan:   Airway Management Planned: Natural Airway and Nasal Cannula  Additional Equipment:   Intra-op Plan:   Post-operative Plan:   Informed Consent: I have reviewed the patients History and Physical, chart, labs and discussed the procedure including  the risks, benefits and alternatives for the proposed anesthesia with the patient or authorized representative who has indicated his/her understanding and acceptance.     Dental Advisory Given  Plan Discussed with: Anesthesiologist, CRNA and Surgeon  Anesthesia Plan Comments: (Patient consented for risks of anesthesia including but not limited to:  - adverse reactions to medications - damage to eyes, teeth, lips or other oral mucosa - nerve damage due to positioning  - sore throat or hoarseness - Damage to heart, brain, nerves, lungs, other parts of body or loss of life  Patient voiced understanding and assent.)         Anesthesia Quick Evaluation

## 2024-01-10 NOTE — Anesthesia Postprocedure Evaluation (Signed)
 Anesthesia Post Note  Patient: JAVOHN BASEY  Procedure(s) Performed: PHACOEMULSIFICATION, CATARACT, WITH IOL INSERTION 21.89, 02:25.7 (Right)  Patient location during evaluation: PACU Anesthesia Type: MAC Level of consciousness: awake and alert Pain management: pain level controlled Vital Signs Assessment: post-procedure vital signs reviewed and stable Respiratory status: spontaneous breathing, nonlabored ventilation, respiratory function stable and patient connected to nasal cannula oxygen Cardiovascular status: stable and blood pressure returned to baseline Postop Assessment: no apparent nausea or vomiting Anesthetic complications: no   No notable events documented.   Last Vitals:  Vitals:   01/10/24 1325 01/10/24 1328  BP: (!) 142/64 (!) 136/90  Pulse: (!) 59 (!) 56  Resp: 17 15  Temp:  (!) 36.3 C  SpO2: 97% 98%    Last Pain:  Vitals:   01/10/24 1328  TempSrc:   PainSc: 0-No pain                 Lendia LITTIE Mae

## 2024-01-10 NOTE — Op Note (Signed)
 OPERATIVE NOTE  Steven Watson [id number] 01/10/2024   PREOPERATIVE DIAGNOSIS: Nuclear sclerotic cataract right eye. H25.11   POSTOPERATIVE DIAGNOSIS: Nuclear sclerotic cataract right eye. H25.11   PROCEDURE:  Phacoemusification with Toric posterior chamber intraocular lens placement of the right eye  Ultrasound time: Procedures: PHACOEMULSIFICATION, CATARACT, WITH IOL INSERTION 21.89, 02:25.7 (Right)  LENS:   Implant Name Type Inv. Item Serial No. Manufacturer Lot No. LRB No. Used Action  LENS IOL ENVISTA ENVY TRC 20.5 - [id number]  LENS IOL ENVISTA ENVY TRC 20.5 [id number] [employer]  Right 1 Implanted    ETN Toric intraocular lens with 5.00 diopters of cylindrical power with axis orientation at 017 degrees.  SURGEON:  Feliciano HERO. Enola, MD   ANESTHESIA:  Topical with tetracaine drops, augmented with 1% preservative-free intracameral lidocaine.   COMPLICATIONS:  None.   DESCRIPTION OF PROCEDURE:  The patient was identified in the holding room and transported to the operating room and placed in the supine position under the operating microscope.  The right eye was identified as the operative eye, which was prepped and draped in the usual sterile ophthalmic fashion.   A 1 millimeter clear-corneal paracentesis was made superotemporally. Preservative-free 1% lidocaine mixed with 1:1,000 bisulfite-free aqueous solution of epinephrine was injected into the anterior chamber. The anterior chamber was then filled with Viscoat viscoelastic. A 2.4 millimeter keratome was used to make a clear-corneal incision inferotemporally.  The pupil remained miotic so a 6.25 mm Malyugin ring was injected and engaged the pupil margin to achieve adequate mydriasis for the remainder of the case. A curvilinear capsulorrhexis was made with a cystotome and capsulorrhexis forceps. Balanced salt solution was used to hydrodissect and hydrodelineate the nucleus. Phacoemulsification was then used to remove the lens  nucleus and epinucleus. The remaining cortex was then removed using the irrigation and aspiration handpiece. Provisc was then placed into the capsular bag to distend it for lens placement. The Verion digital marker was used to align the implant at the intended axis.  A +20.50 D [id number] Toric lens was then injected into the capsular bag.  It was rotated clockwise until the axis marks on the lens were approximately 15 degrees in the counterclockwise direction to the intended alignment. The Malyugin ring was disengaged from the pupil margin and removed from the eye.  The viscoelastic was aspirated from the eye using the irrigation aspiration handpiece.  Then, a blunt chopper through the sideport incision was used to rotate the lens in a clockwise direction until the axis markings of the intraocular lens were lined up with the Verion alignment.  Balanced salt solution was then used to hydrate the wounds.   The anterior chamber was inflated to a physiologic pressure with balanced salt solution.  No wound leaks were noted. Moxifloxacin was injected intracamerally.  Timolol and Brimonidine drops were applied to the eye.  The patient was taken to the recovery room in stable condition without complications of anesthesia or surgery.  [employer] 01/10/2024, 1:20 PM

## 2024-02-26 IMAGING — CT CT CHEST LUNG CANCER SCREENING LOW DOSE W/O CM
2 of 5 series · 15 of 40 positions shown, 18 images · non-contrast
Comparison: None.

CLINICAL DATA: Lung cancer screening. Current asymptomatic smoker.
Twenty-five pack-year history.



[Series 3: lung 1.00 · axial · 0.67mm/px · z∈[-1362,-1024]mm · 12 of 372 slices shown, 15 images]
[im 17/372  mediastinal]
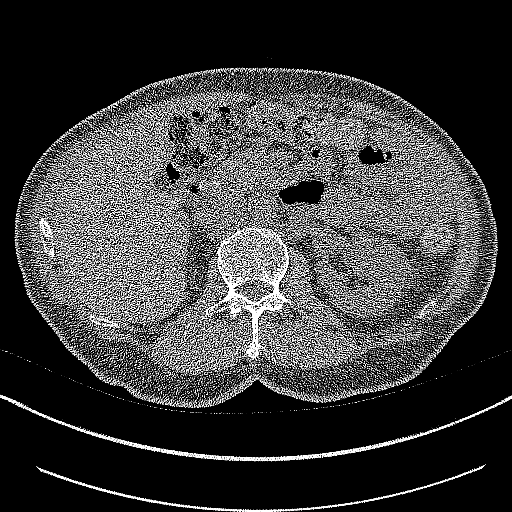
[im 17/372  lung]
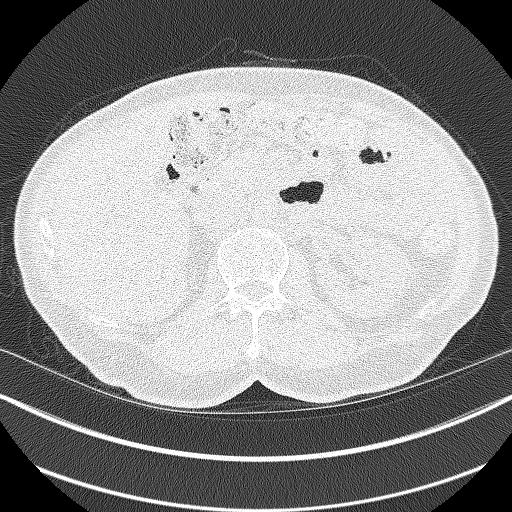
[im 51/372  lung]
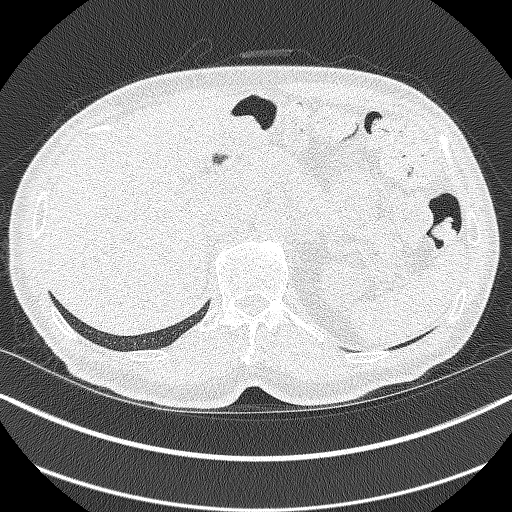
[im 85/372  lung]
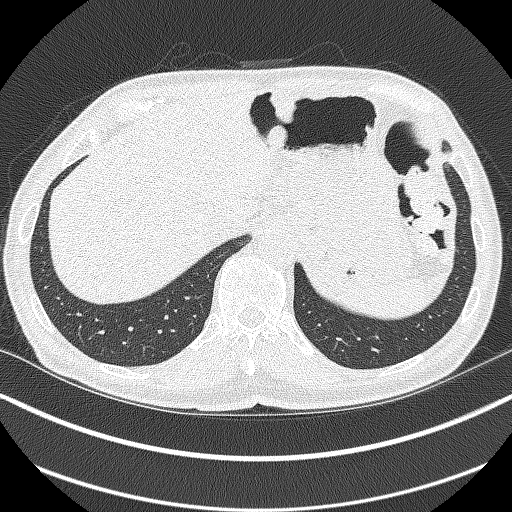
[im 119/372  lung]
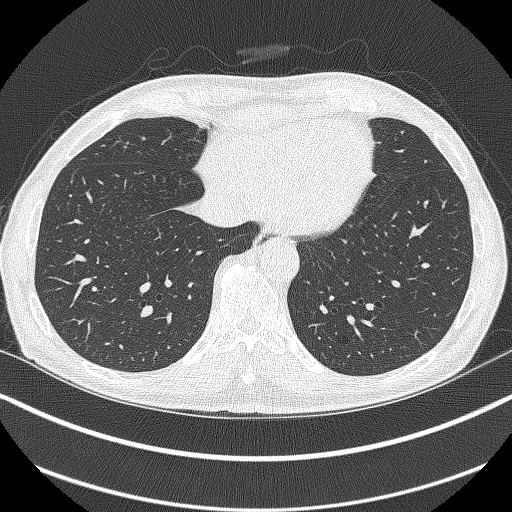
[im 135/372  mediastinal]
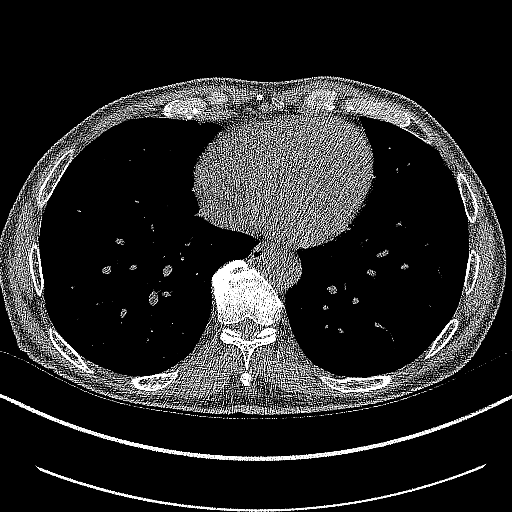
[im 135/372  lung]
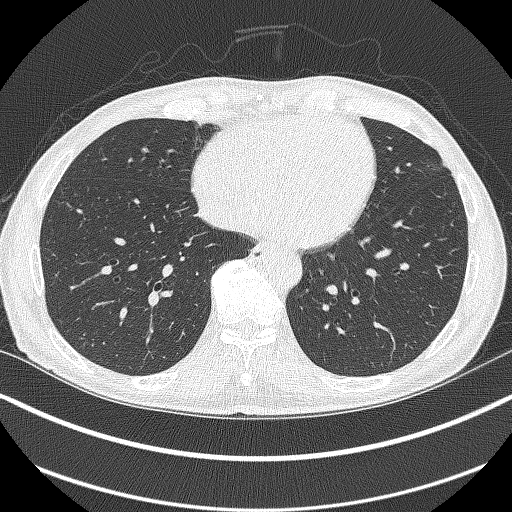
[im 169/372  lung]
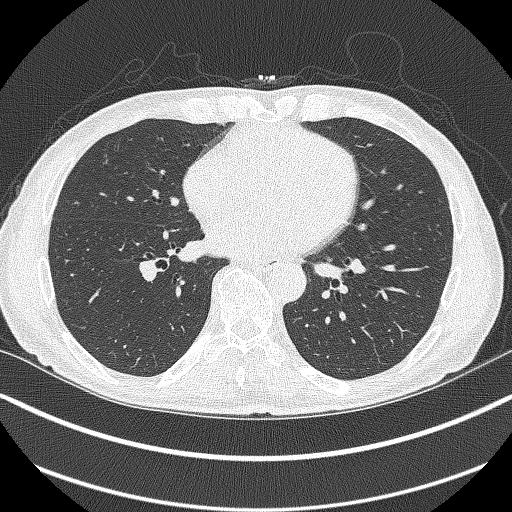
[im 203/372  lung]
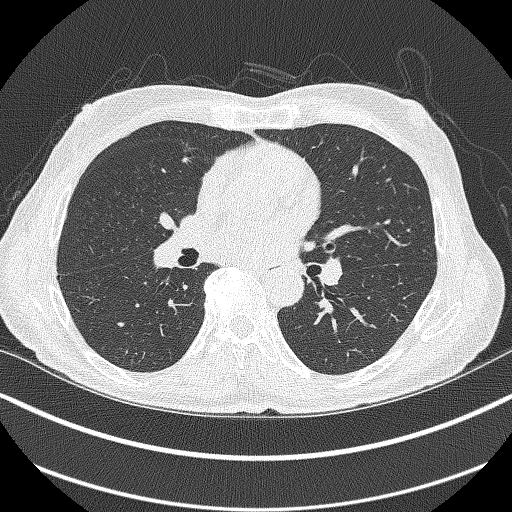
[im 237/372  lung]
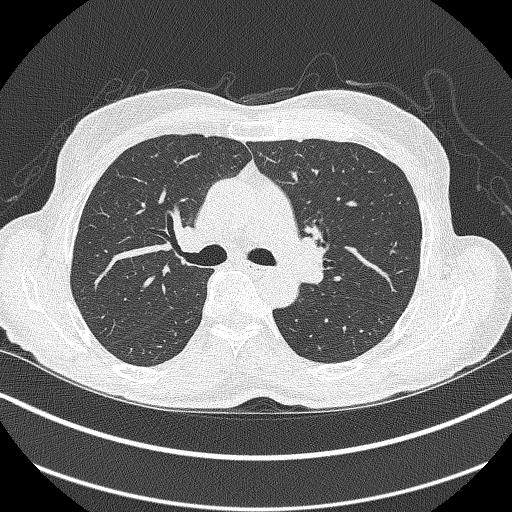
[im 253/372  mediastinal]
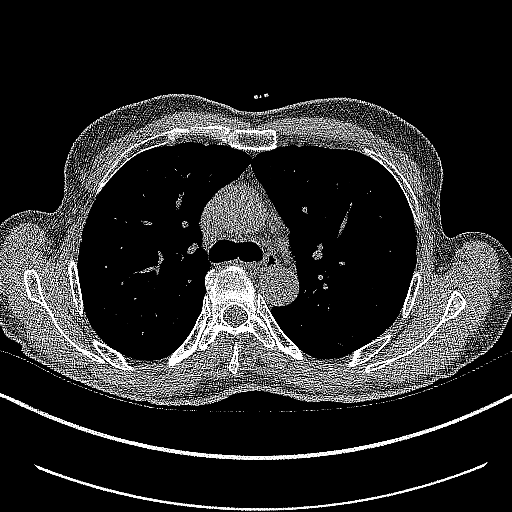
[im 253/372  lung]
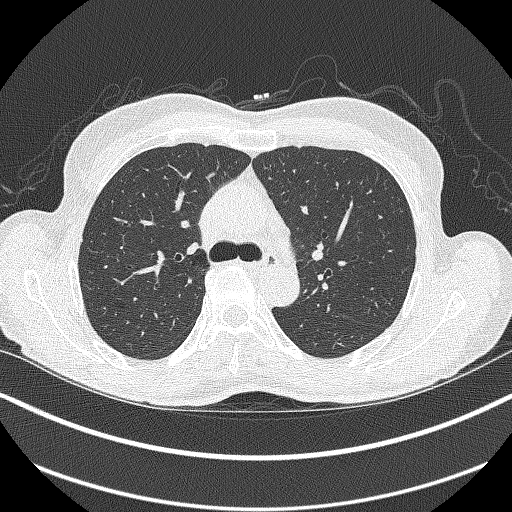
[im 287/372  lung]
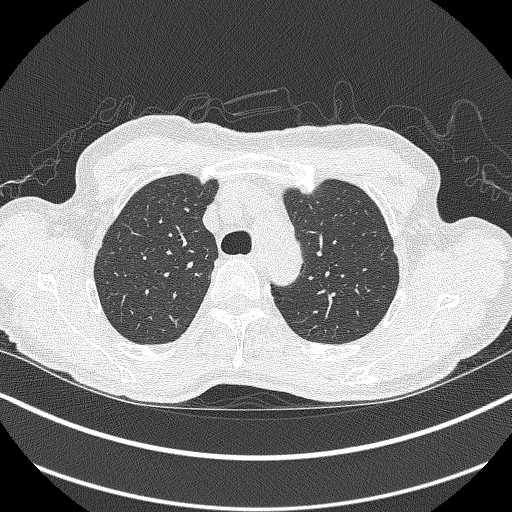
[im 321/372  lung]
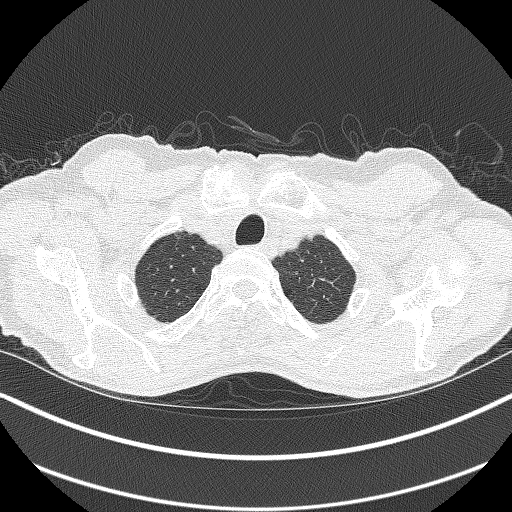
[im 355/372  lung]
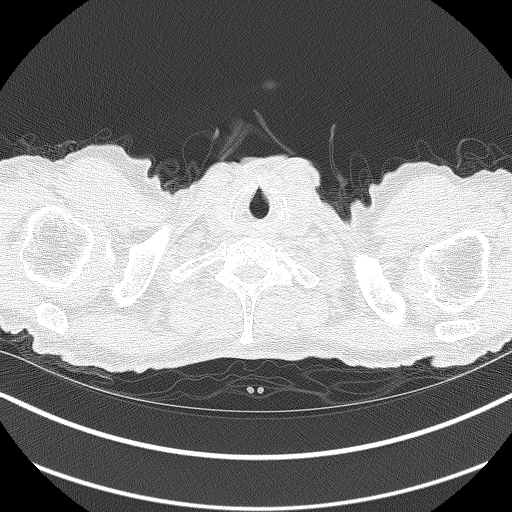

[Series 5: coronals lung 1.00 cor · coronal · 0.67mm/px · 3 of 248 slices shown]
[im 50/248  lung]
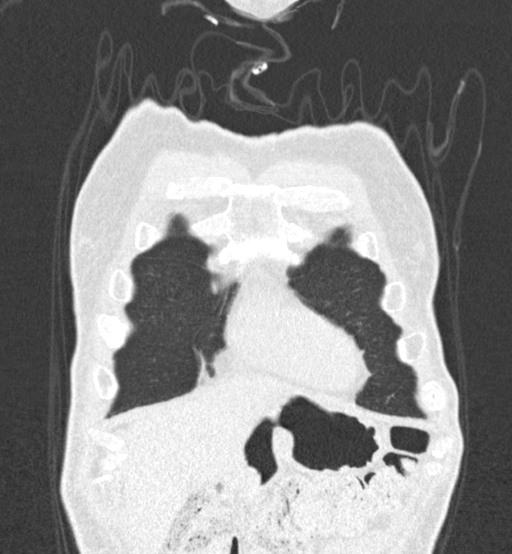
[im 99/248  lung]
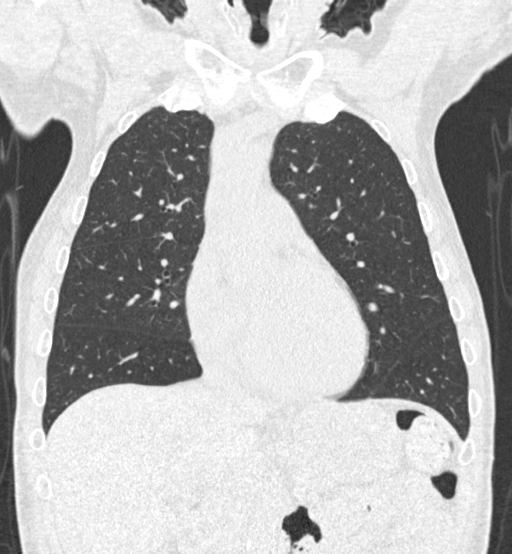
[im 149/248  lung]
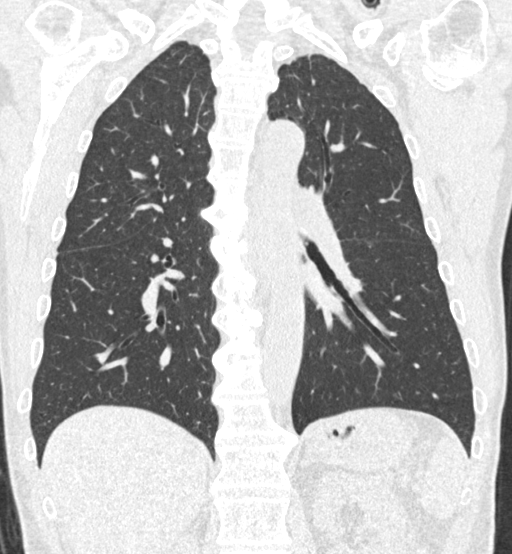

[15 of 40 positions shown; findings below may reference images not displayed]

FINDINGS: Cardiovascular: Heart size is normal. Mild aortic atherosclerosis.
No pericardial effusion.

Mediastinum/Nodes: No enlarged mediastinal, hilar, or axillary lymph
nodes. Thyroid gland, trachea, and esophagus demonstrate no
significant findings.

Lungs/Pleura: Paraseptal and centrilobular emphysema. No pleural
effusion, airspace consolidation, or pneumothorax. Two small lung
nodules are identified. The largest is in the anterior right middle
lobe with a mean derived diameter of 3.3 mm. No suspicious nodules
identified at this time.

Upper Abdomen: Aortic atherosclerotic calcifications. Status post
coli cystectomy.

Musculoskeletal: No chest wall mass or suspicious bone lesions
identified. Thoracic spondylosis.
IMPRESSION: 1. Lung-RADS 2, benign appearance or behavior. Continue annual
screening with low-dose chest CT without contrast in 12 months.
2. Aortic Atherosclerosis (P569F-5XX.X) and Emphysema ([id number].F).
